# Patient Record
Sex: Female | Born: 1965 | Race: White | Hispanic: No | Marital: Single | Smoking: Never smoker
Health system: Southern US, Community
[De-identification: ages and names within clinical notes are randomized; demographics above are authoritative.]

---

## 2015-11-13 ENCOUNTER — Encounter (HOSPITAL_COMMUNITY): Payer: Self-pay | Admitting: Emergency Medicine

## 2015-11-13 ENCOUNTER — Emergency Department (INDEPENDENT_AMBULATORY_CARE_PROVIDER_SITE_OTHER)
Admission: EM | Admit: 2015-11-13 | Discharge: 2015-11-13 | Disposition: A | Discharge status: Home / Self Care | Payer: BLUE CROSS/BLUE SHIELD | Source: Home / Self Care | Attending: Family Medicine | Admitting: Family Medicine

## 2015-11-13 ENCOUNTER — Emergency Department (INDEPENDENT_AMBULATORY_CARE_PROVIDER_SITE_OTHER): Payer: BLUE CROSS/BLUE SHIELD

## 2015-11-13 DIAGNOSIS — S42302A Unspecified fracture of shaft of humerus, left arm, initial encounter for closed fracture: Secondary | ICD-10-CM | POA: Diagnosis not present

## 2015-11-13 MED ORDER — HYDROCODONE-ACETAMINOPHEN 5-325 MG PO TABS: 1.0000 | ORAL_TABLET | ORAL | Status: AC | PRN

## 2015-11-13 NOTE — Discharge Instructions (Signed)
Humerus Fracture Treated With Immobilization °The humerus is the large bone in your upper arm. You have a broken (fractured) humerus. These fractures are easily diagnosed with X-rays. °TREATMENT  °Simple fractures which will heal without disability are treated with simple immobilization. Immobilization means you will wear a cast, splint, or sling. You have a fracture which will do well with immobilization. The fracture will heal well simply by being held in a good position until it is stable enough to begin range of motion exercises. Do not take part in activities which would further injure your arm.  °HOME CARE INSTRUCTIONS  °· Put ice on the injured area. °¨ Put ice in a plastic bag. °¨ Place a towel between your skin and the bag. °¨ Leave the ice on for 15-20 minutes, 03-04 times a day. °· If you have a cast: °¨ Do not scratch the skin under the cast using sharp or pointed objects. °¨ Check the skin around the cast every day. You may put lotion on any red or sore areas. °¨ Keep your cast dry and clean. °· If you have a splint: °¨ Wear the splint as directed. °¨ Keep your splint dry and clean. °¨ You may loosen the elastic around the splint if your fingers become numb, tingle, or turn cold or blue. °· If you have a sling: °¨ Wear the sling as directed. °· Do not put pressure on any part of your cast or splint until it is fully hardened. °· Your cast or splint can be protected during bathing with a plastic bag. Do not lower the cast or splint into water. °· Only take over-the-counter or prescription medicines for pain, discomfort, or fever as directed by your caregiver. °· Do range of motion exercises as instructed by your caregiver. °· Follow up as directed by your caregiver. This is very important in order to avoid permanent injury or disability and chronic pain. °SEEK IMMEDIATE MEDICAL CARE IF:  °· Your skin or nails in the injured arm turn blue or gray. °· Your arm feels cold or numb. °· You develop severe pain  in the injured arm. °· You are having problems with the medicines you were given. °MAKE SURE YOU:  °· Understand these instructions. °· Will watch your condition. °· Will get help right away if you are not doing well or get worse. °  °This information is not intended to replace advice given to you by your health care provider. Make sure you discuss any questions you have with your health care provider. °  °Document Released: 03/03/2001 Document Revised: 12/16/2014 Document Reviewed: 04/19/2015 °[employer]vier Interactive Patient Education ©2016 [employer] ° °

## 2015-11-13 NOTE — ED Notes (Signed)
Patient reports falling on Thursday 12/1.  Slipped in her kitchen on hard wood floors.  Left arm is swollen from shoulder to fingertips.  Points to mid upper arm as location of the most pain.  Patient has bruising, swelling of entire arm.  Radial pulse present, able to move fingers

## 2015-11-13 NOTE — ED Provider Notes (Signed)
CSN: [id number]     Arrival date & time 11/13/15  1300 History   First MD Initiated Contact with Patient 11/13/15 1331     Chief Complaint  Patient presents with  . Arm Pain   (Consider location/radiation/quality/duration/timing/severity/associated sxs/prior Treatment) HPI Comments: 49 year old female states that she fell in her kitchen 3 days ago. This was a slip/trip type fall. She fell on the left side of her body injuring her left arm. Her chief complaint is that of left arm pain as well as persistent swelling, increase in ecchymosis to the humerus, elbow, forearm and hand. She initially did not perceive any significant injury. However since the last day or so with the swelling and increase in ecchymosis she decided to get it checked. She has significant decrease in function of the left upper extremity.   History reviewed. No pertinent past medical history. History reviewed. No pertinent past surgical history. No family history on file. Social History  Substance Use Topics  . Smoking status: Never Smoker   . Smokeless tobacco: None  . Alcohol Use: Yes   OB History    No data available     Review of Systems  Constitutional: Positive for activity change. Negative for fever and fatigue.  Respiratory: Negative.   Cardiovascular: Negative for chest pain and palpitations.  Gastrointestinal: Negative.   Genitourinary: Negative.   Musculoskeletal: Positive for myalgias. Negative for back pain and gait problem.  Skin: Positive for color change.  Neurological: Negative for speech difficulty and headaches.    Allergies  Review of patient's allergies indicates no known allergies.  Home Medications   Prior to Admission medications   Medication Sig Start Date End Date Taking? Authorizing Provider  ibuprofen (ADVIL,MOTRIN) 200 MG tablet Take 200 mg by mouth every 6 (six) hours as needed.   Yes Historical Provider, MD  HYDROcodone-acetaminophen (NORCO/VICODIN) 5-325 MG tablet Take 1  tablet by mouth every 4 (four) hours as needed. 11/13/15   Hayden Rasmussen, NP   Meds Ordered and Administered this Visit  Medications - No data to display  BP 181/126 mmHg  Pulse 126  Temp(Src) 98.7 F (37.1 C) (Oral)  Resp 22  SpO2 99%  LMP 04/12/2014 No data found.   Physical Exam  Constitutional: She is oriented to person, place, and time. She appears well-developed and well-nourished. No distress.  Eyes: EOM are normal.  Neck: Normal range of motion. Neck supple.  Cardiovascular: Normal rate, regular rhythm and normal heart sounds.   Pulmonary/Chest: Effort normal. No respiratory distress.  Musculoskeletal:  Left upper extremity with tenderness to the mid humerus associated with swelling and ecchymosis. There is less tenderness to the left elbow. Ecchymosis and swelling extends from the humerus to the hand. No tenderness to the distal forearm wrist or hand. Radial pulse is 2+. Unable to flex at the elbow. Full range of motion of the wrist and digits.  Neurological: She is alert and oriented to person, place, and time. She exhibits normal muscle tone.  Skin: Skin is warm and dry.  Psychiatric: She has a normal mood and affect.  Nursing note and vitals reviewed.   ED Course  Procedures (including critical care time)  Labs Review Labs Reviewed - No data to display  Imaging Review Dg Elbow Complete Left  11/13/2015  CLINICAL DATA:  Pain and bruising following fall EXAM: LEFT ELBOW - COMPLETE 3+ VIEW COMPARISON:  None. FINDINGS: Frontal, lateral, and bilateral oblique views were obtained. There is extensive soft tissue swelling. No demonstrable fracture or  dislocation. No joint effusion. Joint spaces appear intact. No erosive change. IMPRESSION: Generalized soft tissue swelling. No fracture or dislocation. No appreciable arthropathic change. Electronically Signed   By: Bretta BangWilliam  Woodruff III M.D.   On: 11/13/2015 14:03   Dg Humerus Left  11/13/2015  CLINICAL DATA:  Fall with pain  and ecchymosis.  Initial encounter. EXAM: LEFT HUMERUS - 2+ VIEW COMPARISON:  None. FINDINGS: Comminuted spiraling fracture the proximal humeral diaphysis beginning just below the surgical neck. The fracture is displaced medially by 100%. There is a large lateral butterfly fragment measuring nearly 13 cm in length. Low humeral head, potentially from hemarthrosis. No dislocations suspected. IMPRESSION: 1. Comminuted and displaced upper humerus diaphysis fracture as described above. 2. Low humeral head. Electronically Signed   By: Marnee SpringJonathon  Watts M.D.   On: 11/13/2015 14:03     Visual Acuity Review  Right Eye Distance:   Left Eye Distance:   Bilateral Distance:    Right Eye Near:   Left Eye Near:    Bilateral Near:         MDM   1. Fracture, humerus closed, left, initial encounter    Consulted with Dr. Wandra Feinstein Murphy orthopedist. Apply a sling and see in the office on Wednesday morning. Patient is given instructions for care. Apply ice to the arm. Maintain immobilization and see Dr. Eulah PontMurphy in his office at 8:30 on Wednesday, 2 days.    Hayden Rasmussenavid Danicia Terhaar, NP 11/13/15 1420

## 2016-07-20 IMAGING — DX DG ELBOW COMPLETE 3+V*L*
4 series · 4 of 4 positions shown · non-contrast
Comparison: None.

CLINICAL DATA: Pain and bruising following fall

EXAM:
LEFT ELBOW - COMPLETE 3+ VIEW

[elbow ap]
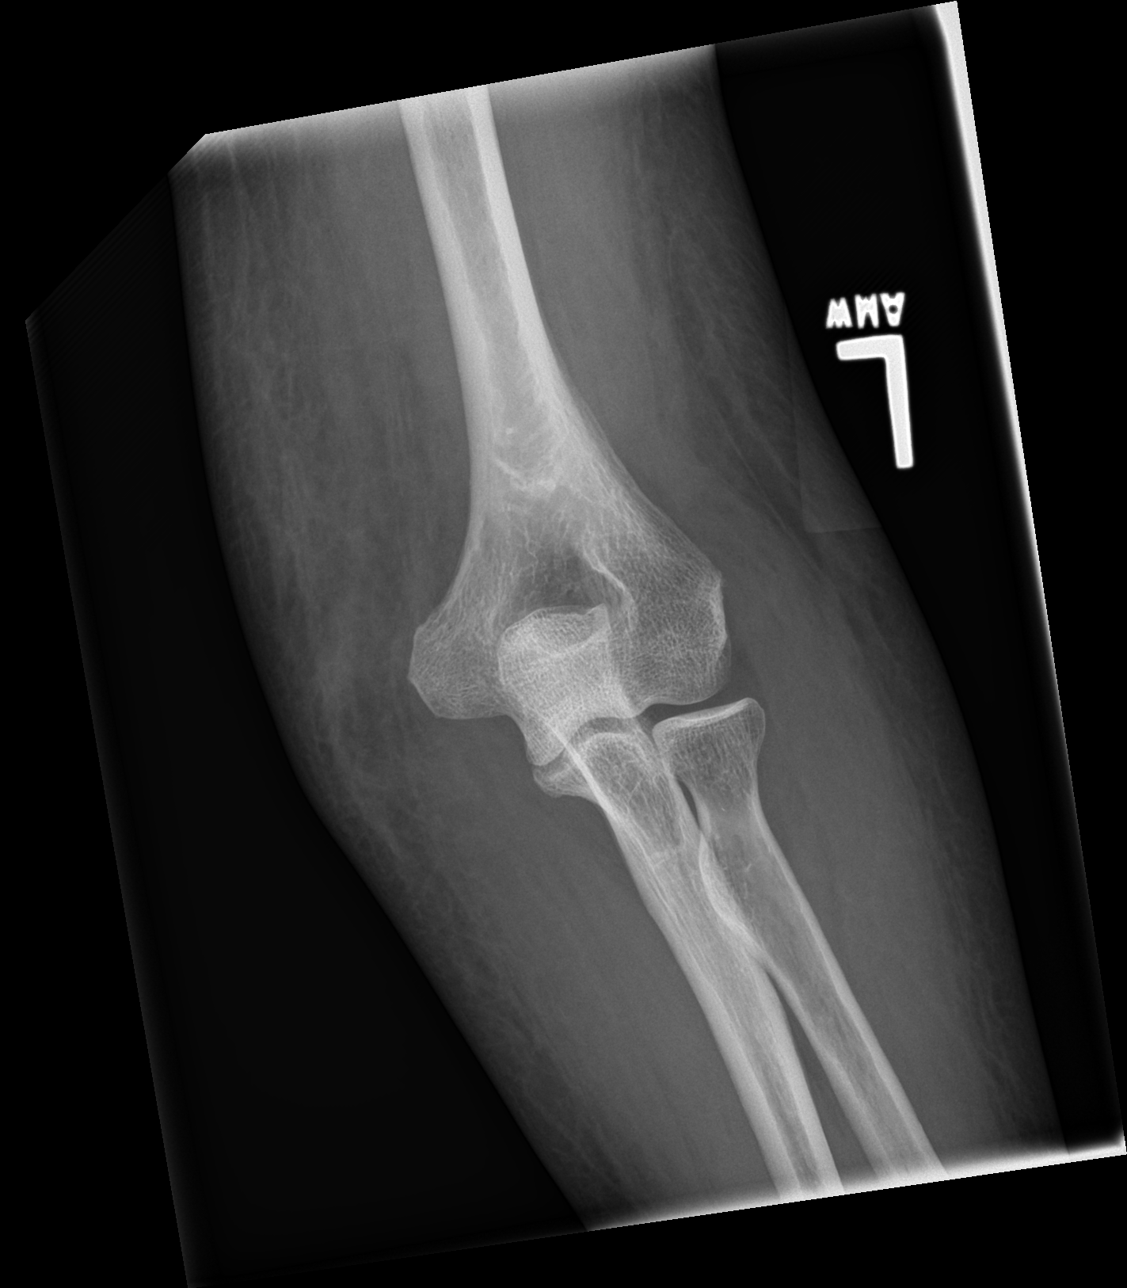

[elbow obl (1 of 2)]
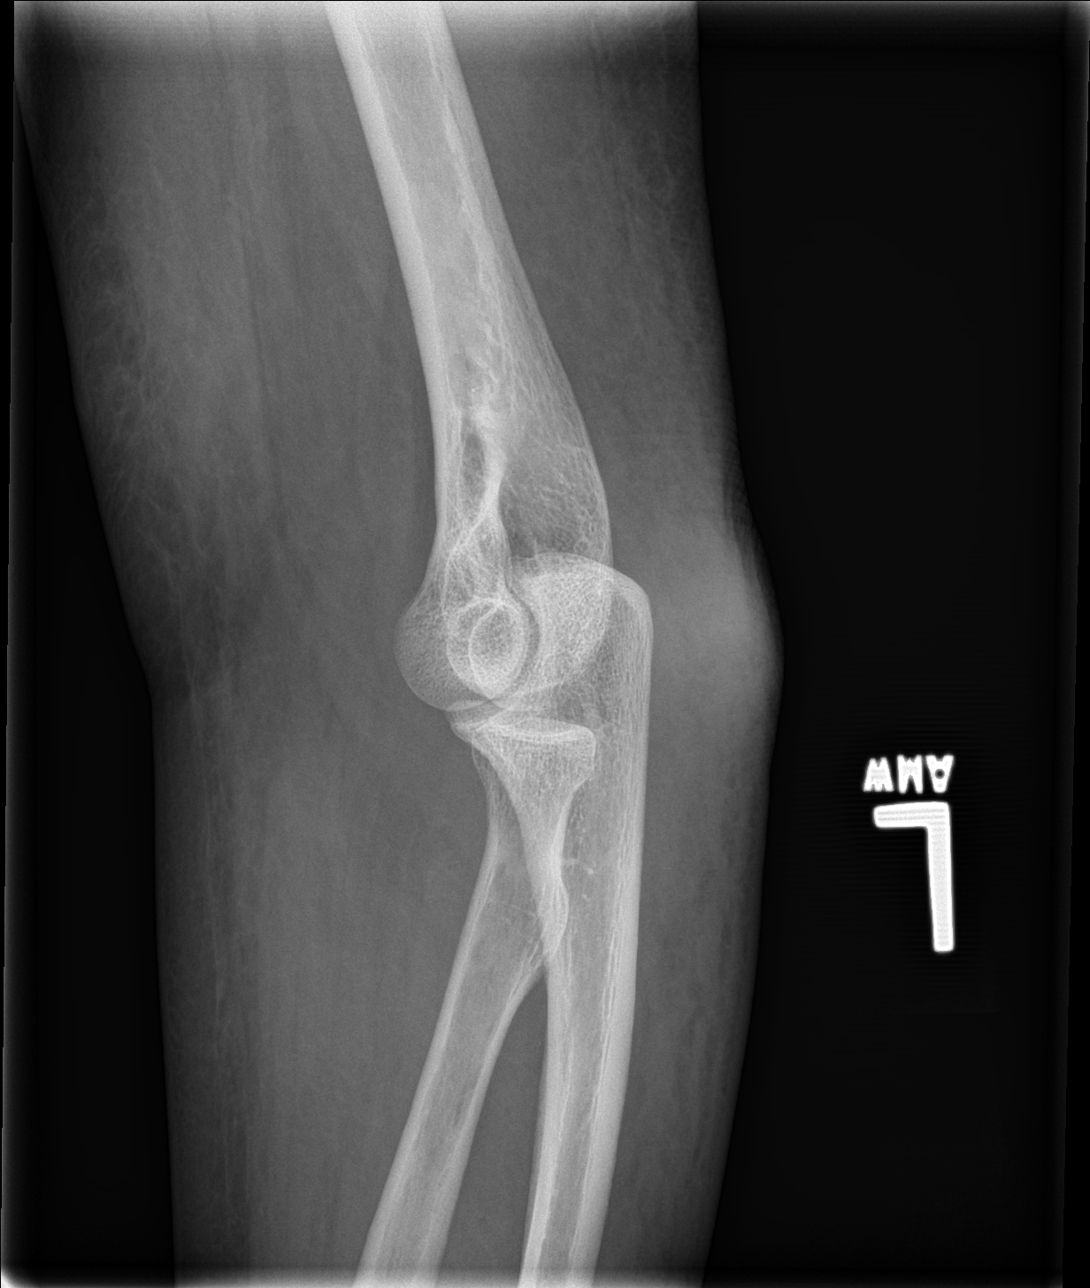

[elbow obl (2 of 2)]
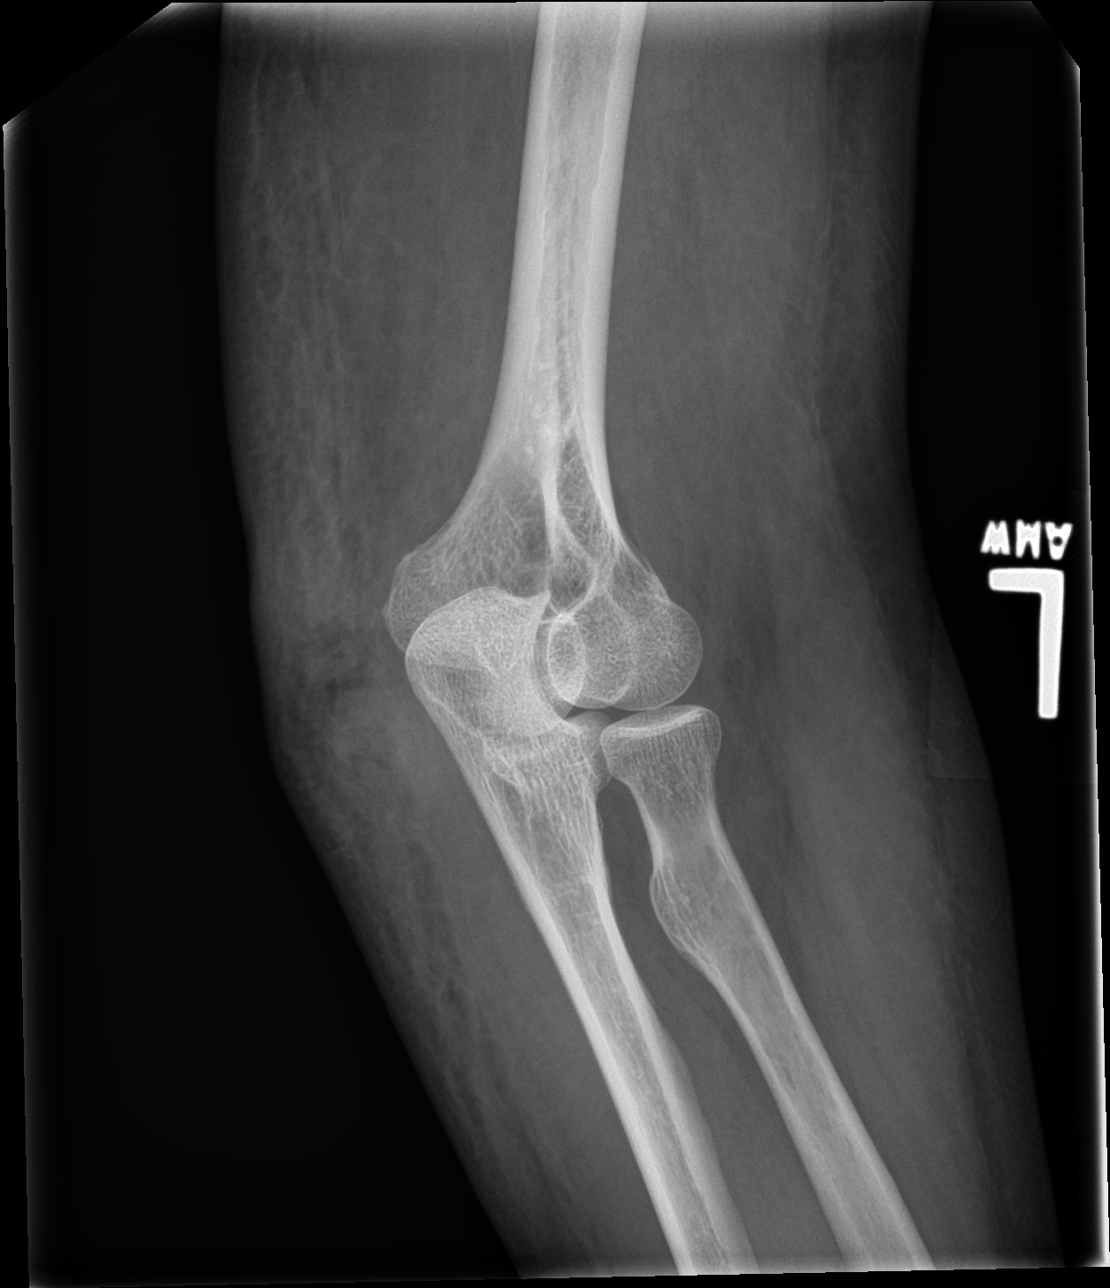

[elbow lat]
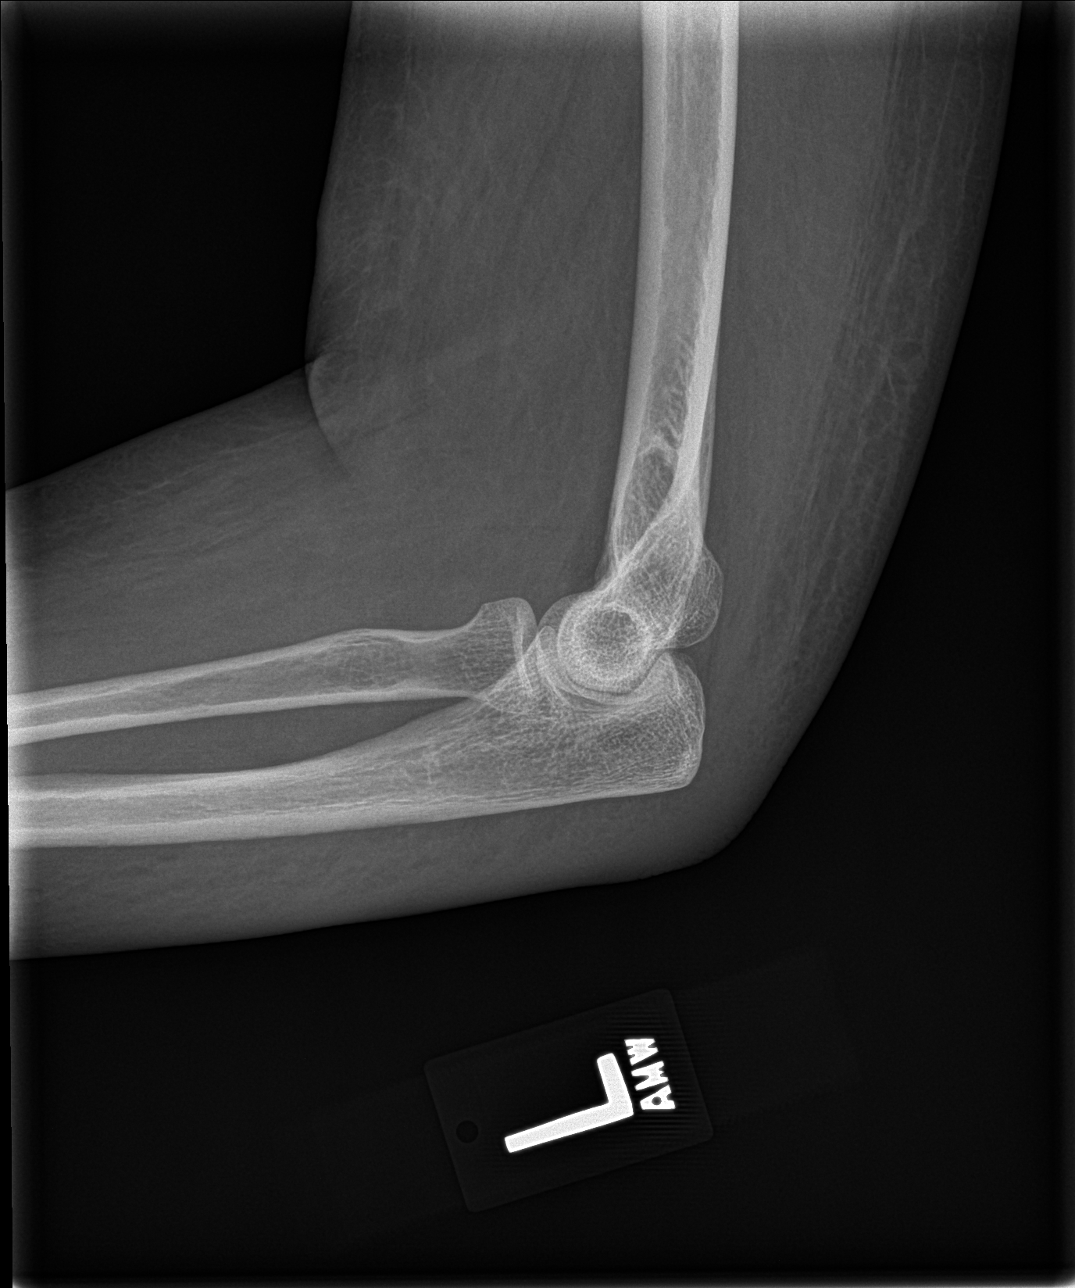

[4 of 4 positions shown; findings below may reference images not displayed]

FINDINGS: Frontal, lateral, and bilateral oblique views were obtained. There
is extensive soft tissue swelling. No demonstrable fracture or
dislocation. No joint effusion. Joint spaces appear intact. No
erosive change.
IMPRESSION: Generalized soft tissue swelling. No fracture or dislocation. No
appreciable arthropathic change.

## 2018-05-09 DEATH — deceased
